# Patient Record
Sex: Male | Born: 1965 | Race: White | Hispanic: No | Marital: Married | Smoking: Former smoker
Health system: Southern US, Community
[De-identification: ages and names within clinical notes are randomized; demographics above are authoritative.]

## PROBLEM LIST (undated history)

## (undated) DIAGNOSIS — I4891 Unspecified atrial fibrillation: Secondary | ICD-10-CM

## (undated) HISTORY — PX: ATRIAL FIBRILLATION ABLATION: EP1191

---

## 2003-12-11 ENCOUNTER — Emergency Department (HOSPITAL_COMMUNITY): Admission: AC | Admit: 2003-12-11 | Discharge: 2003-12-11 | Payer: Self-pay
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
## 2017-02-03 ENCOUNTER — Emergency Department (HOSPITAL_COMMUNITY)
Admission: EM | Admit: 2017-02-03 | Discharge: 2017-02-03 | Disposition: A | Discharge status: Home / Self Care | Payer: BLUE CROSS/BLUE SHIELD | Attending: Emergency Medicine | Admitting: Emergency Medicine

## 2017-02-03 ENCOUNTER — Encounter (HOSPITAL_COMMUNITY): Payer: Self-pay | Admitting: Emergency Medicine

## 2017-02-03 ENCOUNTER — Emergency Department (HOSPITAL_COMMUNITY): Payer: BLUE CROSS/BLUE SHIELD

## 2017-02-03 DIAGNOSIS — Z79899 Other long term (current) drug therapy: Secondary | ICD-10-CM | POA: Diagnosis not present

## 2017-02-03 DIAGNOSIS — Z87891 Personal history of nicotine dependence: Secondary | ICD-10-CM | POA: Diagnosis not present

## 2017-02-03 DIAGNOSIS — R55 Syncope and collapse: Secondary | ICD-10-CM | POA: Diagnosis present

## 2017-02-03 DIAGNOSIS — R079 Chest pain, unspecified: Secondary | ICD-10-CM | POA: Diagnosis not present

## 2017-02-03 HISTORY — DX: Unspecified atrial fibrillation: I48.91

## 2017-02-03 LAB — CBC
HCT: 41.8 % (ref 39.0–52.0)
Hemoglobin: 14.3 g/dL (ref 13.0–17.0)
MCH: 30.8 pg (ref 26.0–34.0)
MCHC: 34.2 g/dL (ref 30.0–36.0)
MCV: 89.9 fL (ref 78.0–100.0)
PLATELETS: 224 10*3/uL (ref 150–400)
RBC: 4.65 MIL/uL (ref 4.22–5.81)
RDW: 13.1 % (ref 11.5–15.5)
WBC: 10.8 10*3/uL — AB (ref 4.0–10.5)

## 2017-02-03 LAB — I-STAT TROPONIN, ED
TROPONIN I, POC: 0 ng/mL (ref 0.00–0.08)
Troponin i, poc: 0 ng/mL (ref 0.00–0.08)

## 2017-02-03 LAB — BASIC METABOLIC PANEL
Anion gap: 9 (ref 5–15)
BUN: 14 mg/dL (ref 6–20)
CO2: 24 mmol/L (ref 22–32)
CREATININE: 1.26 mg/dL — AB (ref 0.61–1.24)
Calcium: 9.3 mg/dL (ref 8.9–10.3)
Chloride: 103 mmol/L (ref 101–111)
GFR calc Af Amer: >60 mL/min (ref >60)
Glucose, Bld: 122 mg/dL — ABNORMAL HIGH (ref 65–99)
POTASSIUM: 4 mmol/L (ref 3.5–5.1)
SODIUM: 136 mmol/L (ref 135–145)

## 2017-02-03 NOTE — Discharge Instructions (Signed)
Eat and drink well for the next couple of days.  Return for worsening chest pain, shortness of breath or repeat event.

## 2017-02-03 NOTE — ED Provider Notes (Signed)
[facility]-EMERGENCY DEPT Provider Note   CSN: [id number] Arrival date & time: 02/03/17  1601     History   Chief Complaint Chief Complaint  Patient presents with  . Loss of Consciousness  . Chest Pain    HPI Stanley Barber is a 51 y.o. male.  51 yo M with a chief complaint of a syncopal event. The patient was in a hot workshop and suddenly felt bad. He stood up from the stool and felt like his capacity out. Got sweaty felt like the throat was closing in on him and then he collapsed ground. After that he had some mild burning to his chest. Nonexertional. Denied shortness of breath or headache. Did hit his head when he collapsed. Denies vomiting denies confusion denies intoxication. Denies history of PE or DVT. Denies hemoptysis denies lower extremity edema denies recent surgery prolonged travel. Denies testosterone use.   The history is provided by the patient.  Loss of Consciousness   This is a new problem. The current episode started 3 to 5 hours ago. The problem occurs rarely. The problem has been resolved. He lost consciousness for a period of less than one minute. Pertinent negatives include abdominal pain, chest pain, confusion, congestion, fever, headaches, palpitations and vomiting. He has tried nothing for the symptoms. The treatment provided no relief.  Chest Pain   Associated symptoms include syncope. Pertinent negatives include no abdominal pain, no fever, no headaches, no palpitations, no shortness of breath and no vomiting.    Past Medical History:  Diagnosis Date  . Atrial fibrillation (HCC)     There are no active problems to display for this patient.   Past Surgical History:  Procedure Laterality Date  . ATRIAL FIBRILLATION ABLATION         Home Medications    Prior to Admission medications   Medication Sig Start Date End Date Taking? Authorizing Provider  cholecalciferol (VITAMIN D) 1000 units tablet Take 2,000 Units by mouth daily.   Yes [provider]  clonazePAM (KLONOPIN) 0.5 MG tablet Take 0.5 mg by mouth 2 (two) times daily as needed for anxiety.   Yes [provider]  metoprolol succinate (TOPROL-XL) 25 MG 24 hr tablet Take 25 mg by mouth daily.   Yes [provider]  naproxen (NAPROSYN) 500 MG tablet Take 500 mg by mouth 2 (two) times daily as needed for mild pain.   Yes [provider]  traMADol (ULTRAM) 50 MG tablet Take 50 mg by mouth every 6 (six) hours as needed for moderate pain.   Yes [provider]    Family History No family history on file.  Social History Social History  Substance Use Topics  . Smoking status: Former Games developer  . Smokeless tobacco: Never Used  . Alcohol use Yes     Allergies   Patient has no known allergies.   Review of Systems Review of Systems  Constitutional: Negative for chills and fever.  HENT: Negative for congestion and facial swelling.   Eyes: Negative for discharge and visual disturbance.  Respiratory: Negative for shortness of breath.   Cardiovascular: Positive for syncope. Negative for chest pain and palpitations.  Gastrointestinal: Negative for abdominal pain, diarrhea and vomiting.  Musculoskeletal: Negative for arthralgias and myalgias.  Skin: Negative for color change and rash.  Neurological: Negative for tremors, syncope and headaches.  Psychiatric/Behavioral: Negative for confusion and dysphoric mood.     Physical Exam Updated Vital Signs BP (!) 142/94 (BP Location: Right Arm)   Pulse  63   Temp 98 F (36.7 C) (Oral)   Resp 13   Ht 5\' 9"  (1.753 m)   Wt 88.5 kg (195 lb)   SpO2 100%   BMI 28.80 kg/m   Physical Exam  Constitutional: He is oriented to person, place, and time. He appears well-developed and well-nourished.  HENT:  Head: Normocephalic and atraumatic.  Eyes: Pupils are equal, round, and reactive to light. EOM are normal.  Neck: Normal range of motion. Neck supple. No JVD present.  Cardiovascular:  Normal rate, regular rhythm and intact distal pulses.  Exam reveals no gallop and no friction rub.   No murmur heard. Pulmonary/Chest: No respiratory distress. He has no wheezes.  Abdominal: He exhibits no distension and no mass. There is no tenderness. There is no rebound and no guarding.  Musculoskeletal: Normal range of motion.  Neurological: He is alert and oriented to person, place, and time.  Skin: No rash noted. No pallor.  Psychiatric: He has a normal mood and affect. His behavior is normal.  Nursing note and vitals reviewed.    ED Treatments / Results  Labs (all labs ordered are listed, but only abnormal results are displayed) Labs Reviewed  BASIC METABOLIC PANEL - Abnormal; Notable for the following:       Result Value   Glucose, Bld 122 (*)    Creatinine, Ser 1.26 (*)    All other components within normal limits  CBC - Abnormal; Notable for the following:    WBC 10.8 (*)    All other components within normal limits  I-STAT TROPONIN, ED  I-STAT TROPONIN, ED    EKG  EKG Interpretation  Date/Time:  Sunday February 03 2017 16:43:32 EDT Ventricular Rate:  60 PR Interval:  154 QRS Duration: 104 QT Interval:  392 QTC Calculation: 392 R Axis:   62 Text Interpretation:  Normal sinus rhythm Early repolarization Normal ECG no wpw prolonged qt or brugada No significant change since last tracing Confirmed by Karlin Binion ([id number]) on 02/03/2017 8:29:33 PM       Radiology Dg Chest 2 View  Result Date: 02/03/2017 CLINICAL DATA:  Syncopal episode today. EXAM: CHEST  2 VIEW COMPARISON:  Chest CT December 11, 2003 FINDINGS: The heart size and mediastinal contours are within normal limits. There is no focal infiltrate, pulmonary edema, or pleural effusion. The visualized skeletal structures are unremarkable. IMPRESSION: No active cardiopulmonary disease. Electronically Signed   By: Wei-Chen  Lin M.D.   On: 02/03/2017 17:22    Procedures Procedures (including critical care  time)  Medications Ordered in ED Medications - No data to display   Initial Impression / Assessment and Plan / ED Course  I have reviewed the triage vital signs and the nursing notes.  Pertinent labs & imaging results that were available during my care of the patient were reviewed by me and considered in my medical decision making (see chart for details).     51  yo M With a chief complaint of a syncopal event. Patient had some chest pain after which. This is resolved by my exam. Initial troponin was negative. With this symptom occurring so recently I will obtain a delta. He is PERC negative.   11:49 PM:  I have discussed the diagnosis/risks/treatment options with the patient and family and believe the pt to be eligible for discharge home to follow-up with PCP. We also discussed returning to the ED immediately if new or worsening sx occur. We discussed the sx which are most concerning (e.g., sudden  worsening pain, fever, inability to tolerate by mouth) that necessitate immediate return. Medications administered to the patient during their visit and any new prescriptions provided to the patient are listed below.  Medications given during this visit Medications - No data to display   The patient appears reasonably screen and/or stabilized for discharge and I doubt any other medical condition or other [facility] requiring further screening, evaluation, or treatment in the ED at this time prior to discharge.    Final Clinical Impressions(s) / ED Diagnoses   Final diagnoses:  Syncope and collapse    New Prescriptions Discharge Medication List as of 02/03/2017 10:22 PM       Melene PlanFloyd, Fahim Kats, DO 02/03/17 [id number]

## 2017-02-03 NOTE — ED Triage Notes (Signed)
Pt states he had a syncopal episode while standing in a hot shop watching a friend work.  Pt hit back of head on concrete floor and was unresponsive for a few seconds.  C/o dizziness and tightness to center of chest since fall.  Abrasion to back of head.  History of ablation for afib.  States he had ate earlier in the day and had drank 2 beer.

## 2019-05-02 IMAGING — CR DG CHEST 2V
2 series · 2 of 2 positions shown · non-contrast
Comparison: Chest CT December 11, 2003

CLINICAL DATA: Syncopal episode today.

EXAM:
CHEST  2 VIEW

[chest pa]
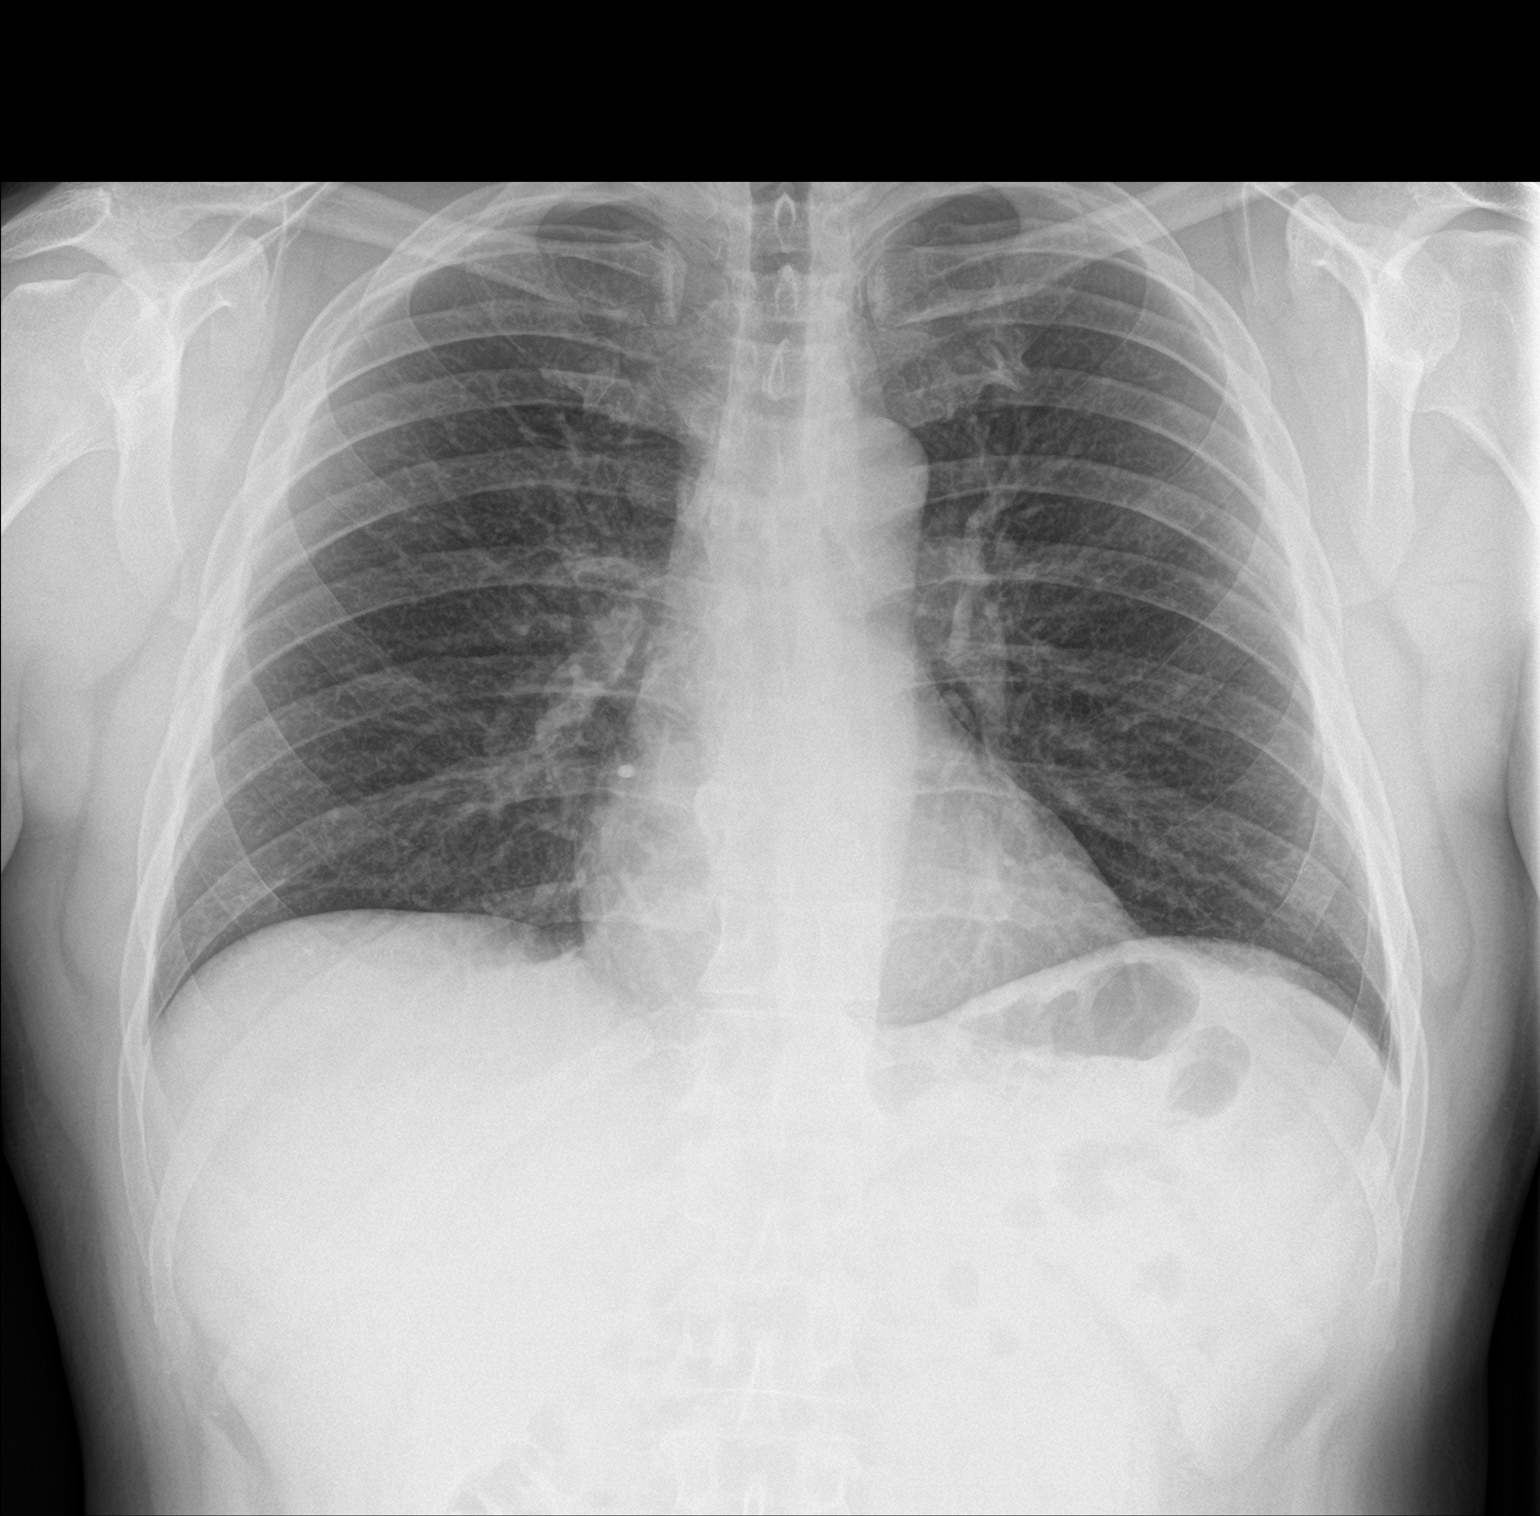

[chest lat]
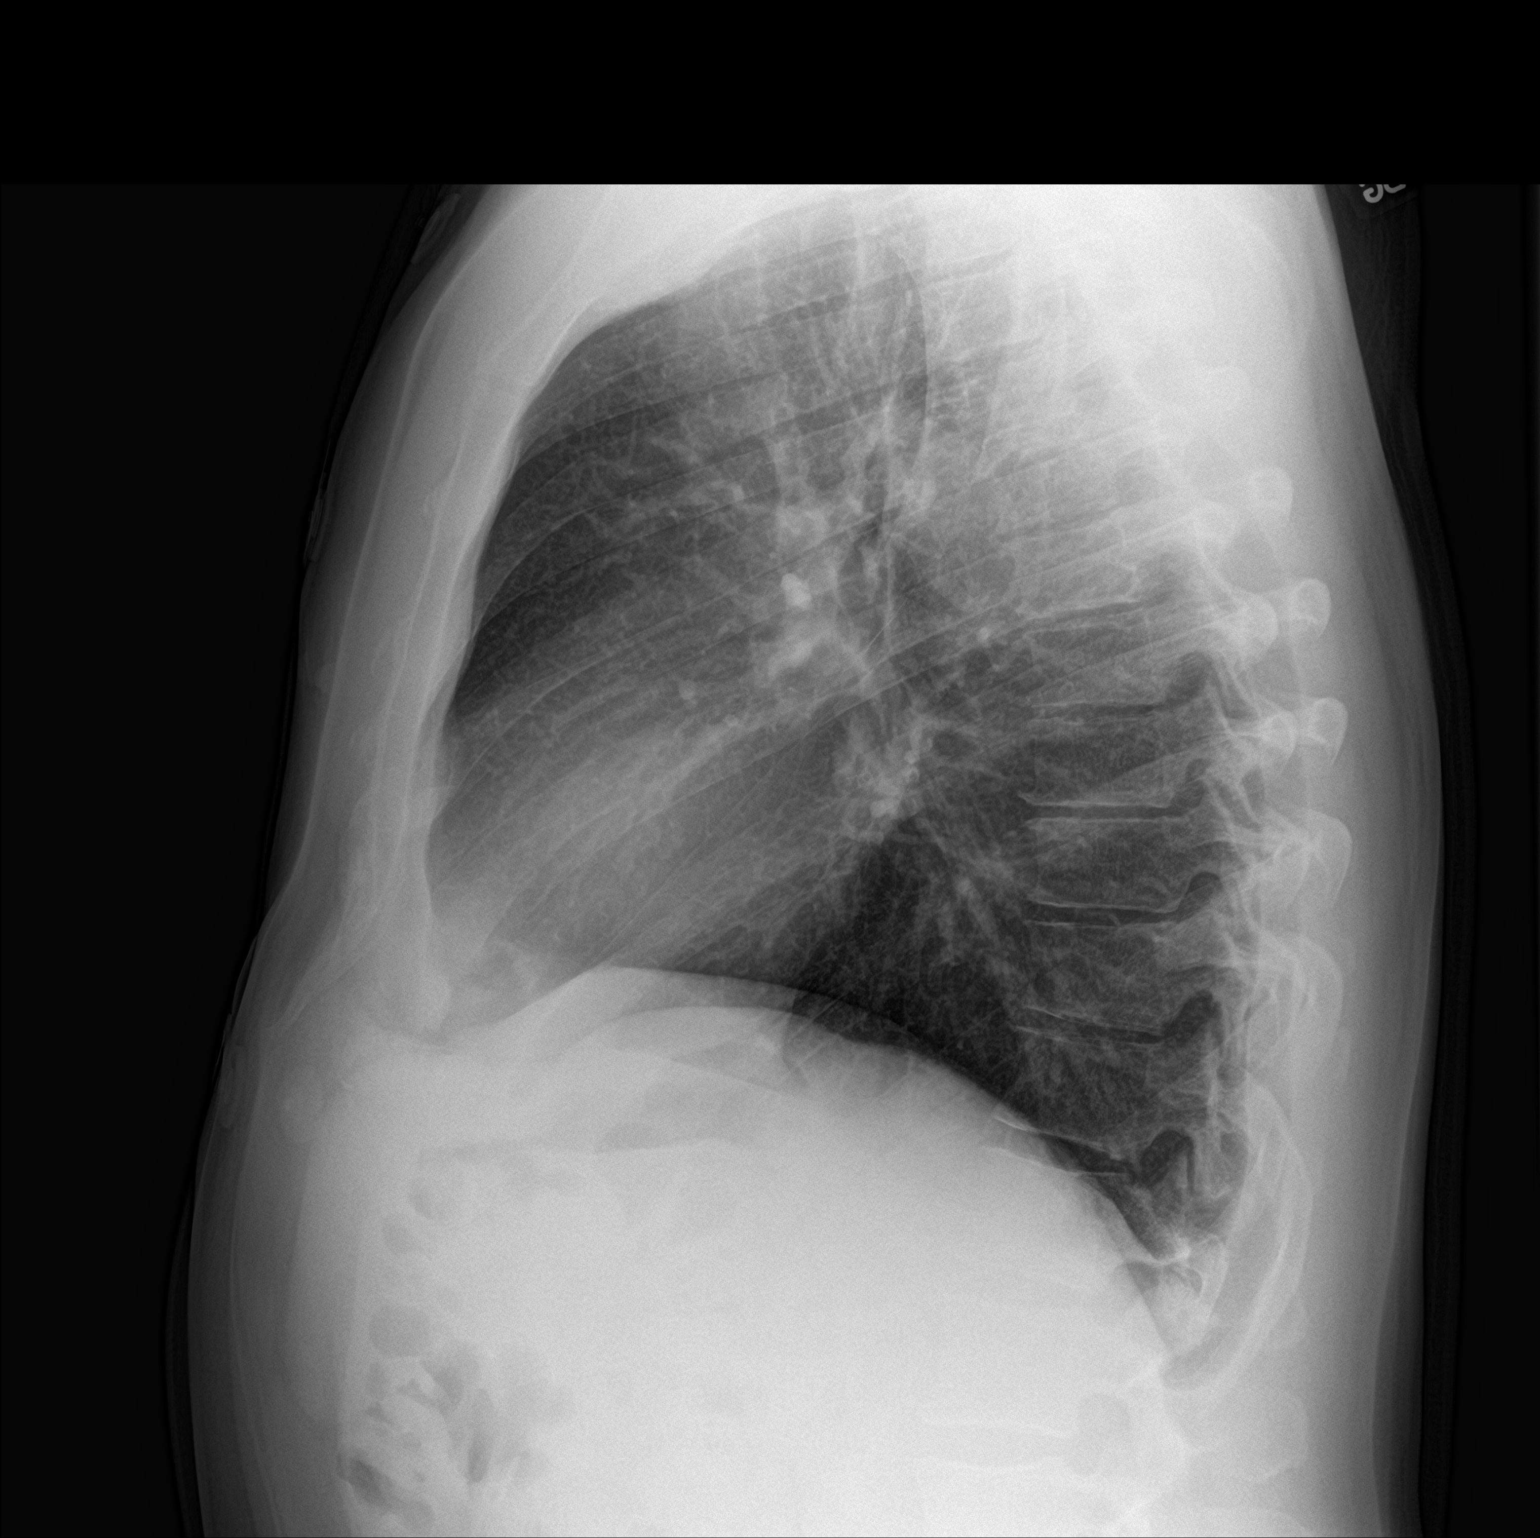

[2 of 2 positions shown; findings below may reference images not displayed]

FINDINGS: The heart size and mediastinal contours are within normal limits.
There is no focal infiltrate, pulmonary edema, or pleural effusion.
The visualized skeletal structures are unremarkable.
IMPRESSION: No active cardiopulmonary disease.
